# Patient Record
Sex: Female | Born: 1995
Health system: Southern US, Community
[De-identification: ages and names within clinical notes are randomized; demographics above are authoritative.]

## PROBLEM LIST (undated history)

## (undated) DIAGNOSIS — I2699 Other pulmonary embolism without acute cor pulmonale: Secondary | ICD-10-CM

## (undated) DIAGNOSIS — I82409 Acute embolism and thrombosis of unspecified deep veins of unspecified lower extremity: Secondary | ICD-10-CM

---
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
## 2016-05-22 IMAGING — CT CT ABD-PELV W/ CM
1 of 2 series · 15 of 32 positions shown, 19 images · IV contrast (OMNIPAQUE)
Comparison: 10/26/2015 and prior ultrasounds.

CLINICAL DATA: 19-year-old female with fever of unknown origin.
Currently on antibiotics for pyelonephritis. Recent spontaneous
abortion on 10/23/2015.

EXAM:
CT ABDOMEN AND PELVIS WITH CONTRAST
TECHNIQUE: Multidetector CT imaging of the abdomen and pelvis was performed
using the standard protocol following bolus administration of
intravenous contrast.
CONTRAST:  100mL OMNIPAQUE IOHEXOL 300 MG/ML  SOLN

[Series 2: routine abdomen/pelvis with · axial · 0.88mm/px · z∈[+625,+1080]mm · 15 of 99 slices shown, 19 images]
[im 4/99  soft-tissue]
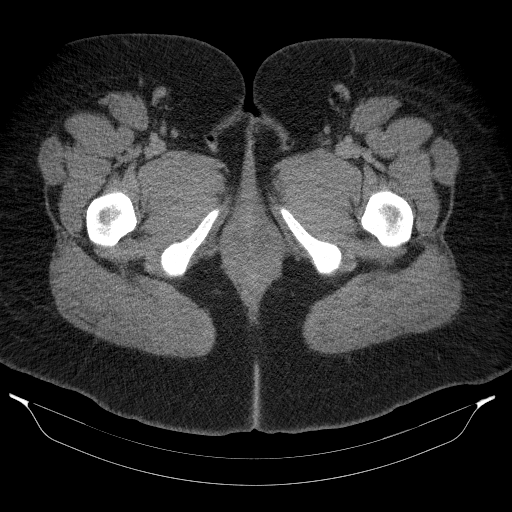
[im 4/99  bone]
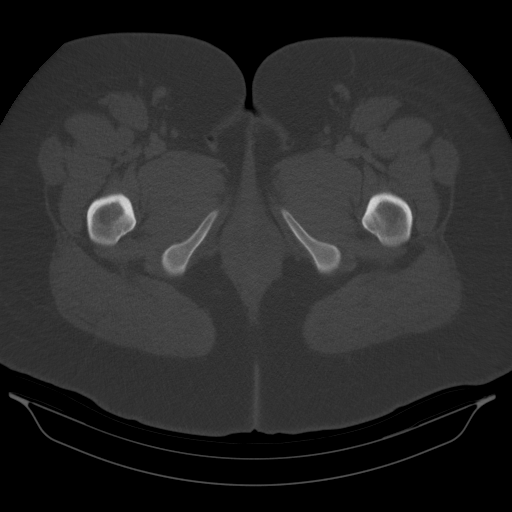
[im 12/99  soft-tissue]
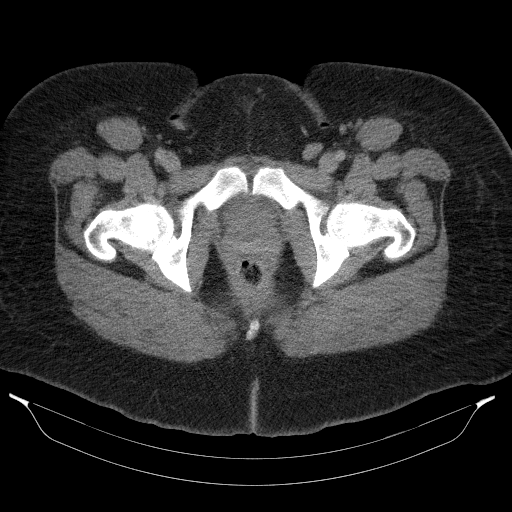
[im 20/99  soft-tissue]
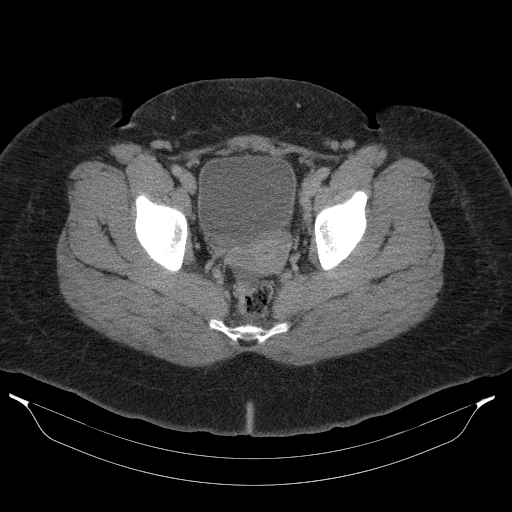
[im 28/99  soft-tissue]
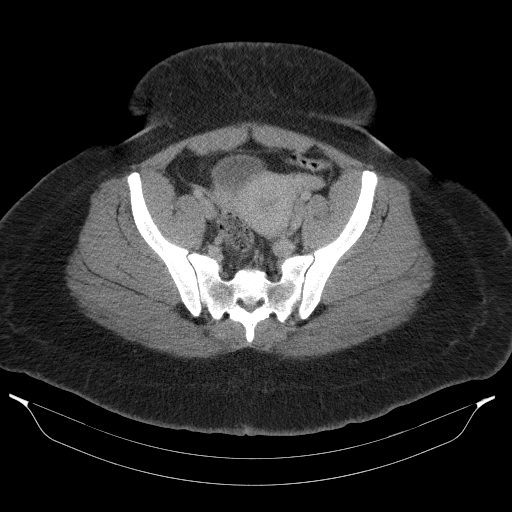
[im 36/99  soft-tissue]
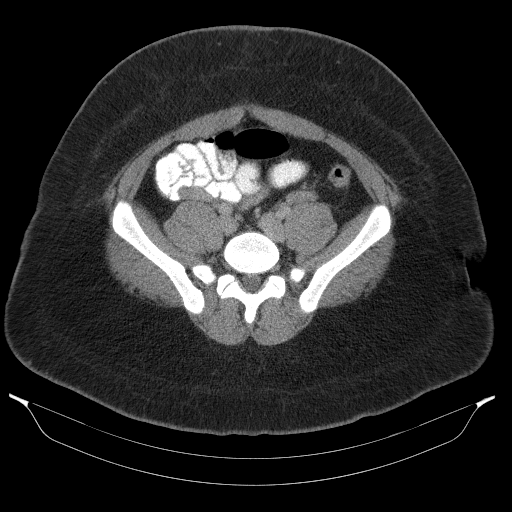
[im 44/99  soft-tissue]
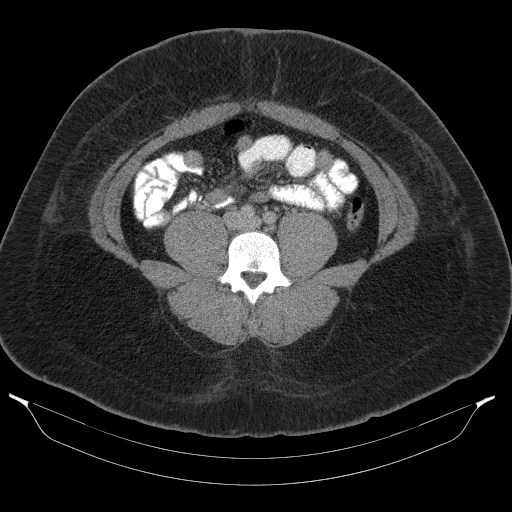
[im 51/99  soft-tissue]
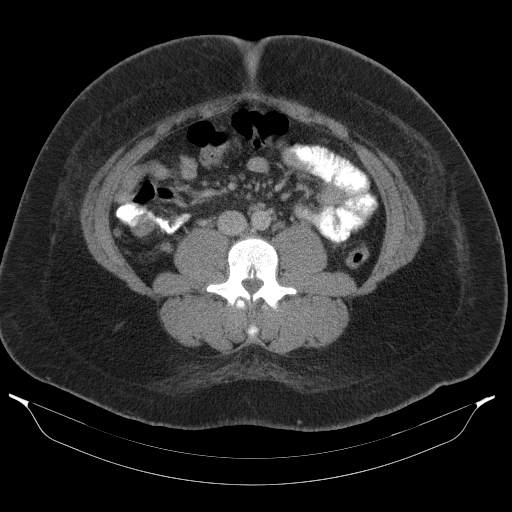
[im 55/99  soft-tissue]
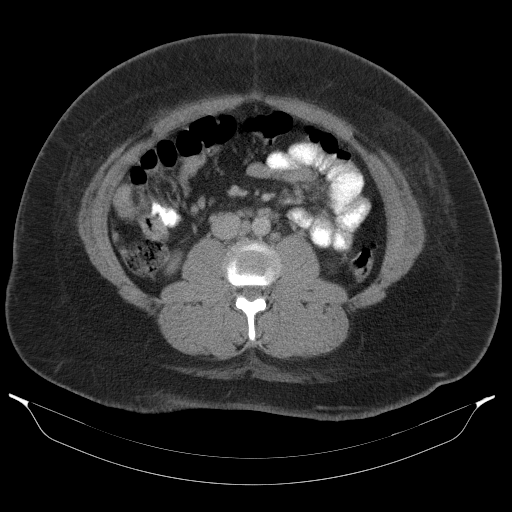
[im 63/99  soft-tissue]
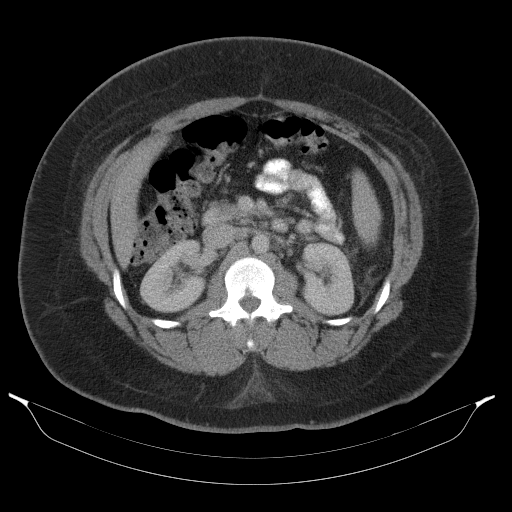
[im 63/99  bone]
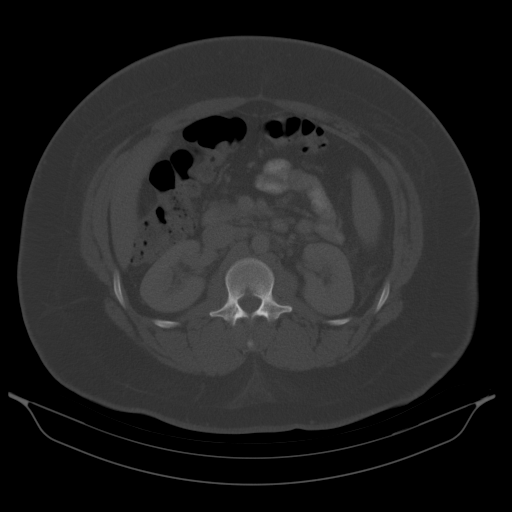
[im 71/99  soft-tissue]
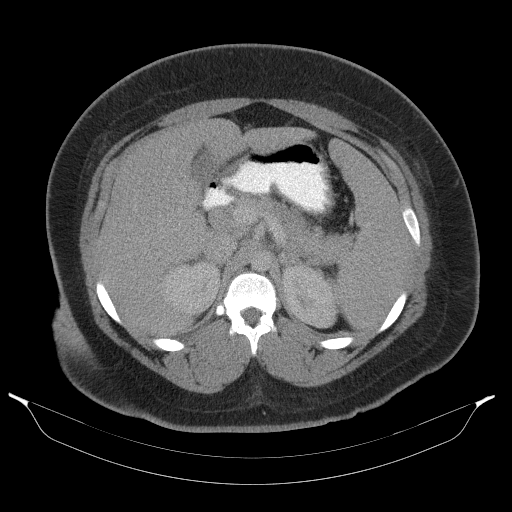
[im 79/99  soft-tissue]
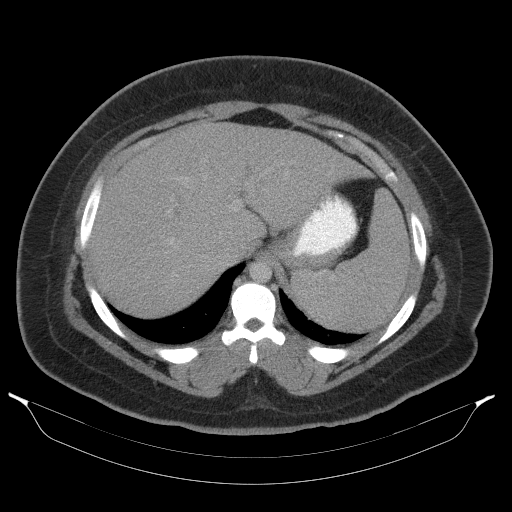
[im 83/99  lung]
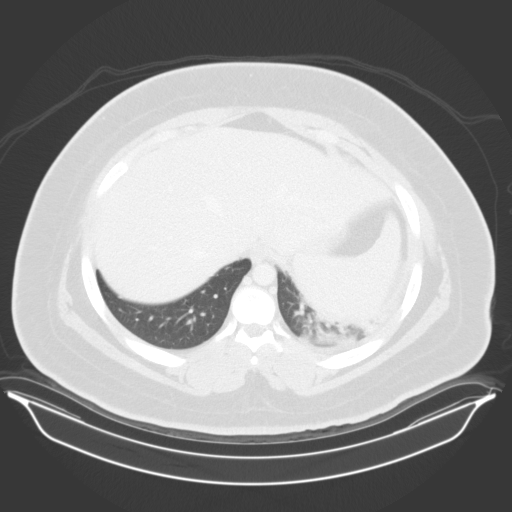
[im 87/99  soft-tissue]
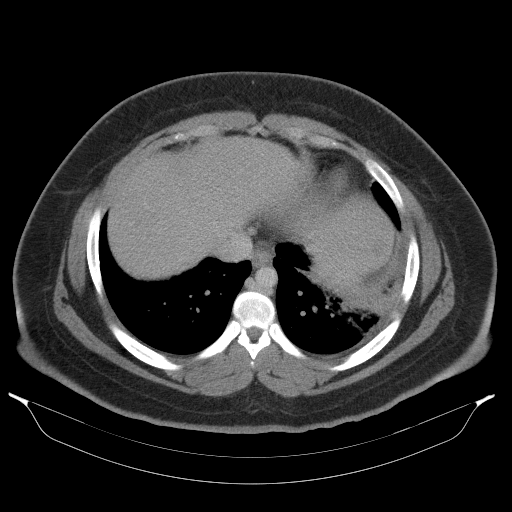
[im 87/99  lung]
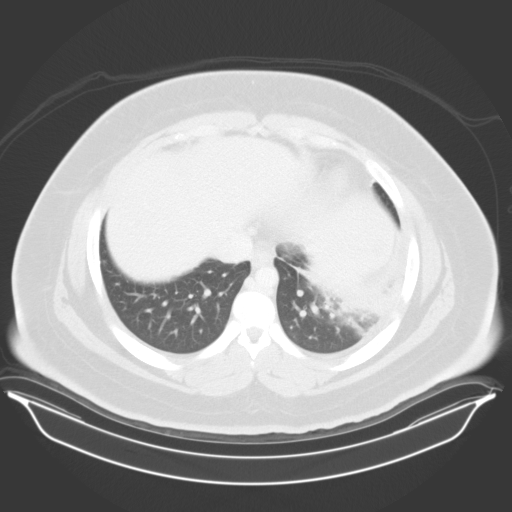
[im 91/99  lung]
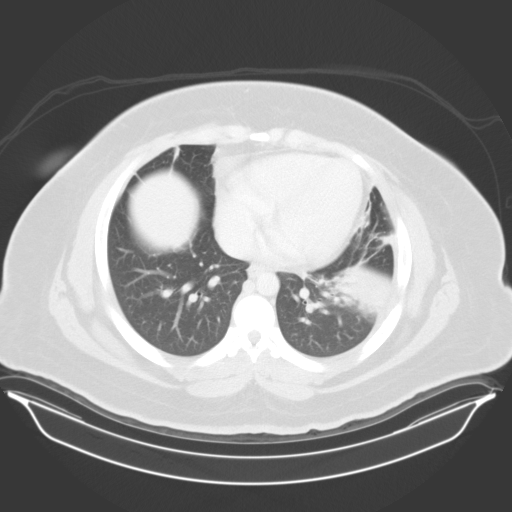
[im 95/99  soft-tissue]
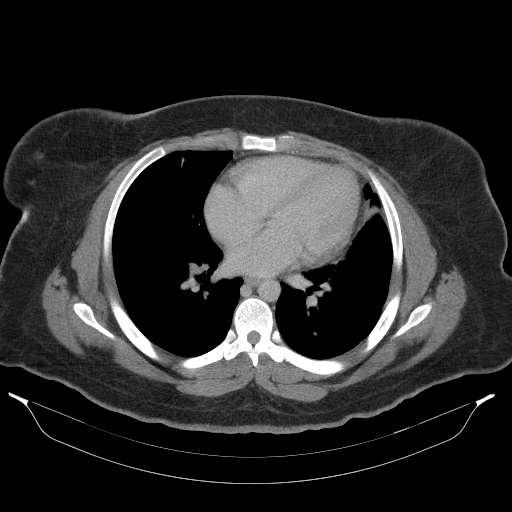
[im 95/99  lung]
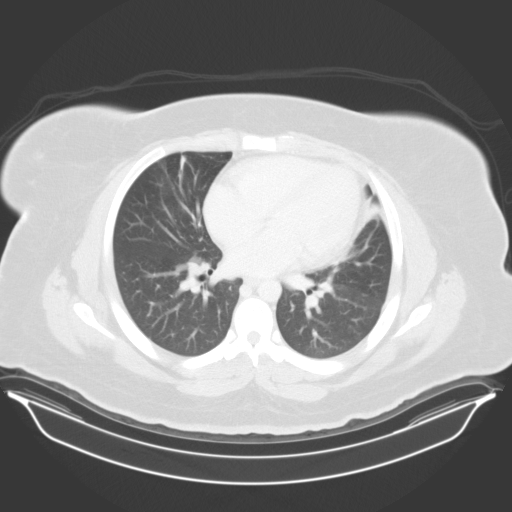

[15 of 32 positions shown; findings below may reference images not displayed]

FINDINGS: Lower chest: Moderate left lower lobe consolidation/possible
atelectasis noted and suspicious for pneumonia.

Hepatobiliary: The liver and gallbladder are unremarkable. There is
no evidence of biliary dilatation.

Pancreas: Unremarkable

Spleen: Unremarkable

Adrenals/Urinary Tract: The kidneys, adrenal glands and bladder are
unremarkable.

Stomach/Bowel: There is no evidence of bowel obstruction or focal
bowel wall thickening. The appendix is normal.

Vascular/Lymphatic: Shotty abdominal and retroperitoneal lymph nodes
are noted without enlarged lymph nodes identified. There is no
evidence of abdominal aortic aneurysm.

Reproductive: The uterus and adnexal regions are within normal
limits.

Other: No free fluid, abscess or pneumoperitoneum.

Musculoskeletal: No acute or suspicious abnormalities.
IMPRESSION: Left lower lobe consolidation/ possible atelectasis - suspicious for
pneumonia

No evidence of acute abnormality within the abdomen or pelvis.

## 2016-10-07 ENCOUNTER — Emergency Department (HOSPITAL_COMMUNITY): Payer: BLUE CROSS/BLUE SHIELD

## 2016-10-07 ENCOUNTER — Encounter (HOSPITAL_COMMUNITY): Payer: Self-pay | Admitting: Emergency Medicine

## 2016-10-07 ENCOUNTER — Emergency Department (HOSPITAL_BASED_OUTPATIENT_CLINIC_OR_DEPARTMENT_OTHER)
Admit: 2016-10-07 | Discharge: 2016-10-07 | Disposition: A | Discharge status: Home / Self Care | Payer: BLUE CROSS/BLUE SHIELD | Attending: Emergency Medicine | Admitting: Emergency Medicine

## 2016-10-07 ENCOUNTER — Observation Stay (HOSPITAL_COMMUNITY)
Admission: EM | Admit: 2016-10-07 | Discharge: 2016-10-08 | Disposition: A | Discharge status: Home / Self Care | Payer: BLUE CROSS/BLUE SHIELD | Attending: Internal Medicine | Admitting: Internal Medicine

## 2016-10-07 DIAGNOSIS — I82409 Acute embolism and thrombosis of unspecified deep veins of unspecified lower extremity: Secondary | ICD-10-CM

## 2016-10-07 DIAGNOSIS — Z6841 Body Mass Index (BMI) 40.0 and over, adult: Secondary | ICD-10-CM

## 2016-10-07 DIAGNOSIS — E871 Hypo-osmolality and hyponatremia: Secondary | ICD-10-CM | POA: Diagnosis not present

## 2016-10-07 DIAGNOSIS — B178 Other specified acute viral hepatitis: Secondary | ICD-10-CM | POA: Diagnosis present

## 2016-10-07 DIAGNOSIS — K759 Inflammatory liver disease, unspecified: Secondary | ICD-10-CM | POA: Insufficient documentation

## 2016-10-07 DIAGNOSIS — Z68.41 Body mass index (BMI) pediatric, greater than or equal to 95th percentile for age: Secondary | ICD-10-CM | POA: Insufficient documentation

## 2016-10-07 DIAGNOSIS — D72829 Elevated white blood cell count, unspecified: Secondary | ICD-10-CM | POA: Diagnosis not present

## 2016-10-07 DIAGNOSIS — M79661 Pain in right lower leg: Secondary | ICD-10-CM | POA: Diagnosis present

## 2016-10-07 DIAGNOSIS — I82441 Acute embolism and thrombosis of right tibial vein: Secondary | ICD-10-CM | POA: Diagnosis not present

## 2016-10-07 DIAGNOSIS — J9601 Acute respiratory failure with hypoxia: Secondary | ICD-10-CM | POA: Diagnosis not present

## 2016-10-07 DIAGNOSIS — B2709 Gammaherpesviral mononucleosis with other complications: Secondary | ICD-10-CM

## 2016-10-07 DIAGNOSIS — I272 Pulmonary hypertension, unspecified: Secondary | ICD-10-CM | POA: Diagnosis not present

## 2016-10-07 DIAGNOSIS — I82401 Acute embolism and thrombosis of unspecified deep veins of right lower extremity: Secondary | ICD-10-CM | POA: Diagnosis present

## 2016-10-07 DIAGNOSIS — M79609 Pain in unspecified limb: Secondary | ICD-10-CM | POA: Diagnosis not present

## 2016-10-07 DIAGNOSIS — I2699 Other pulmonary embolism without acute cor pulmonale: Secondary | ICD-10-CM | POA: Diagnosis not present

## 2016-10-07 HISTORY — DX: Other pulmonary embolism without acute cor pulmonale: I26.99

## 2016-10-07 HISTORY — DX: Acute embolism and thrombosis of unspecified deep veins of unspecified lower extremity: I82.409

## 2016-10-07 LAB — BASIC METABOLIC PANEL
Anion gap: 9 (ref 5–15)
BUN: 6 mg/dL (ref 6–20)
CALCIUM: 8.9 mg/dL (ref 8.9–10.3)
CO2: 21 mmol/L — AB (ref 22–32)
CREATININE: 1.27 mg/dL — AB (ref 0.44–1.00)
Chloride: 103 mmol/L (ref 101–111)
GFR calc Af Amer: >60 mL/min (ref >60)
GFR calc non Af Amer: >60 mL/min (ref >60)
GLUCOSE: 94 mg/dL (ref 65–99)
Potassium: 3.9 mmol/L (ref 3.5–5.1)
Sodium: 133 mmol/L — ABNORMAL LOW (ref 135–145)

## 2016-10-07 LAB — CBC WITH DIFFERENTIAL/PLATELET
BASOS PCT: 0 %
Basophils Absolute: 0 10*3/uL (ref 0.0–0.1)
EOS ABS: 0.5 10*3/uL (ref 0.0–0.7)
Eosinophils Relative: 4 %
HCT: 39.5 % (ref 36.0–46.0)
Hemoglobin: 13.7 g/dL (ref 12.0–15.0)
LYMPHS ABS: 2.6 10*3/uL (ref 0.7–4.0)
Lymphocytes Relative: 23 %
MCH: 28.2 pg (ref 26.0–34.0)
MCHC: 34.7 g/dL (ref 30.0–36.0)
MCV: 81.4 fL (ref 78.0–100.0)
MONO ABS: 0.5 10*3/uL (ref 0.1–1.0)
MONOS PCT: 4 %
Neutro Abs: 7.9 10*3/uL — ABNORMAL HIGH (ref 1.7–7.7)
Neutrophils Relative %: 69 %
Platelets: 225 10*3/uL (ref 150–400)
RBC: 4.85 MIL/uL (ref 3.87–5.11)
RDW: 16.3 % — AB (ref 11.5–15.5)
WBC: 11.5 10*3/uL — ABNORMAL HIGH (ref 4.0–10.5)

## 2016-10-07 LAB — I-STAT TROPONIN, ED: TROPONIN I, POC: 0 ng/mL (ref 0.00–0.08)

## 2016-10-07 LAB — HEPATIC FUNCTION PANEL
ALT: 23 U/L (ref 14–54)
AST: 27 U/L (ref 15–41)
Albumin: 3.7 g/dL (ref 3.5–5.0)
Alkaline Phosphatase: 59 U/L (ref 38–126)
BILIRUBIN DIRECT: 0.2 mg/dL (ref 0.1–0.5)
BILIRUBIN INDIRECT: 0.6 mg/dL (ref 0.3–0.9)
BILIRUBIN TOTAL: 0.8 mg/dL (ref 0.3–1.2)
Total Protein: 6.8 g/dL (ref 6.5–8.1)

## 2016-10-07 LAB — I-STAT BETA HCG BLOOD, ED (MC, WL, AP ONLY)

## 2016-10-07 LAB — I-STAT CREATININE, ED: Creatinine, Ser: 1.2 mg/dL — ABNORMAL HIGH (ref 0.44–1.00)

## 2016-10-07 LAB — TROPONIN I: Troponin I: <0.03 ng/mL (ref <0.03)

## 2016-10-07 MED ORDER — ACETAMINOPHEN 650 MG RE SUPP: 650.0000 mg | Freq: Four times a day (QID) | RECTAL | Status: DC | PRN

## 2016-10-07 MED ORDER — RIVAROXABAN 15 MG PO TABS
15.0000 mg | ORAL_TABLET | Freq: Once | ORAL | Status: AC
  Administered 2016-10-07: 15 mg via ORAL
  Filled 2016-10-07: qty 1

## 2016-10-07 MED ORDER — ACETAMINOPHEN 325 MG PO TABS: 650.0000 mg | ORAL_TABLET | Freq: Four times a day (QID) | ORAL | Status: DC | PRN

## 2016-10-07 MED ORDER — SODIUM CHLORIDE 0.9% FLUSH: 3.0000 mL | Freq: Two times a day (BID) | INTRAVENOUS | Status: DC

## 2016-10-07 MED ORDER — RIVAROXABAN 15 MG PO TABS
15.0000 mg | ORAL_TABLET | Freq: Two times a day (BID) | ORAL | Status: DC
  Filled 2016-10-07: qty 1

## 2016-10-07 MED ORDER — MELOXICAM 15 MG PO TABS
15.0000 mg | ORAL_TABLET | Freq: Once | ORAL | Status: AC
  Administered 2016-10-07: 15 mg via ORAL
  Filled 2016-10-07: qty 1

## 2016-10-07 MED ORDER — SODIUM CHLORIDE 0.9 % IV SOLN
INTRAVENOUS | Status: DC
  Administered 2016-10-07 – 2016-10-08 (×2): via INTRAVENOUS

## 2016-10-07 MED ORDER — RIVAROXABAN 20 MG PO TABS: 20.0000 mg | ORAL_TABLET | Freq: Every day | ORAL | Status: DC

## 2016-10-07 MED ORDER — IOPAMIDOL (ISOVUE-370) INJECTION 76%
INTRAVENOUS | Status: AC
  Administered 2016-10-07: 100 mL
  Filled 2016-10-07: qty 100

## 2016-10-07 MED ORDER — TRAMADOL HCL 50 MG PO TABS: 50.0000 mg | ORAL_TABLET | Freq: Four times a day (QID) | ORAL | Status: DC | PRN

## 2016-10-07 NOTE — ED Triage Notes (Signed)
States developed right calf pain radiating to right foot. Pedal pulse +2 full sensation. Pain currently 7/10 achy sore.

## 2016-10-07 NOTE — Progress Notes (Signed)
Patient arrived to unit, denies pain, CCMD notified, call bell within reach, and patient oriented to unit.  Karen HarrisonSamantha K Smrithi Pigford, RN

## 2016-10-07 NOTE — ED Notes (Signed)
EDPA requested we hold Xarelto until PE study has been performed and results are available.

## 2016-10-07 NOTE — Progress Notes (Signed)
VASCULAR LAB PRELIMINARY  PRELIMINARY  PRELIMINARY  PRELIMINARY  Right lower extremity venous duplex completed.    Preliminary report:  There is acute DVT in the right posterior tibial vein.  All other veins appear thrombus free.   Called report to Terance HartKelly Gekas, PA-C  Rainn Zupko, RVT 10/07/2016, 11:01 AM

## 2016-10-07 NOTE — ED Provider Notes (Signed)
[facility]-EMERGENCY DEPT Provider Note   CSN: [id number] Arrival date & time: 10/07/16  4098   By signing my name below, I, Teofilo Pod, attest that this documentation has been prepared under the direction and in the presence of Terance Hart, PA-C. Electronically Signed: Teofilo Pod, ED Scribe. 10/07/2016. 10:10 AM.   History   Chief Complaint Chief Complaint  Patient presents with  . Leg Pain   The history is provided by the patient. No language interpreter was used.   HPI Comments:  Karen Pugh is a 20 y.o. female who presents to the Emergency Department complaining of constant lower right leg pain x 3 days. Pt states that the pain is located primarily on the back of her right calf with some associated swelling, and with pain radiating to her toes. Pt rates the pain at 7/10. Pt is ambulatory. Pt states that she [facility] and had blood drawn to test for DVT, no results as of now. Pt currently takes birth control pill. Pt has taken ibuprofen for pain with no relief. Pt denies chest pain, SOB. Pt also denies injury/trauma, pain elsewhere.   Past Medical History:  Diagnosis Date  . Medical history non-contributory     Patient Active Problem List   Diagnosis Date Noted  . EBV hepatitis   . Acute pyelonephritis   . Elevated liver enzymes   . Fever 10/23/2015  . Pyelonephritis affecting pregnancy in first trimester 10/23/2015  . Acute renal insufficiency 10/23/2015  . SAB (spontaneous abortion) 10/23/2015    Past Surgical History:  Procedure Laterality Date  . DILATION AND CURETTAGE OF UTERUS      OB History    Gravida Para Term Preterm AB Living   2       1     SAB TAB Ectopic Multiple Live Births   1               Home Medications    Prior to Admission medications   Not on File    Family History No family history on file.  Social History Social History  Substance Use Topics  . Smoking status: Never Smoker  .  Smokeless tobacco: Never Used  . Alcohol use No     Allergies   Review of patient's allergies indicates no known allergies.   Review of Systems Review of Systems  Respiratory: Negative for shortness of breath.   Cardiovascular: Negative for chest pain.  Musculoskeletal: Positive for arthralgias, joint swelling and myalgias.  All other systems reviewed and are negative.    Physical Exam Updated Vital Signs BP 110/83 (BP Location: Right Arm)   Pulse 109   Temp 98.2 F (36.8 C) (Oral)   Resp 20   Ht 5\' 5"  (1.651 m)   Wt (!) 305 lb (138.3 kg)   SpO2 100%   BMI 50.75 kg/m   Physical Exam  Constitutional: She appears well-developed and well-nourished. No distress.  HENT:  Head: Normocephalic and atraumatic.  Eyes: Conjunctivae are normal.  Cardiovascular: Regular rhythm.  Tachycardia present.  Exam reveals no gallop and no friction rub.   No murmur heard. Pulmonary/Chest: Effort normal and breath sounds normal. No respiratory distress. She has no wheezes. She has no rales. She exhibits no tenderness.  Abdominal: She exhibits no distension.  Musculoskeletal:  R calf tenderness posteriorly. No edema or erythema  Neurological: She is alert.  Skin: Skin is warm and dry.  Psychiatric: She has a normal mood and affect.  Nursing note and vitals reviewed.    ED Treatments / Results  DIAGNOSTIC STUDIES:  Oxygen Saturation is 100% on RA, normal by my interpretation.    COORDINATION OF CARE:  10:10 AM Will order ultrasound. Discussed treatment plan with pt at bedside and pt agreed to plan.   Labs (all labs ordered are listed, but only abnormal results are displayed) Labs Reviewed  BASIC METABOLIC PANEL - Abnormal; Notable for the following:       Result Value   Sodium 133 (*)    CO2 21 (*)    Creatinine, Ser 1.27 (*)    All other components within normal limits  CBC WITH DIFFERENTIAL/PLATELET - Abnormal; Notable for the following:    WBC 11.5 (*)    RDW 16.3 (*)      Neutro Abs 7.9 (*)    All other components within normal limits  I-STAT CREATININE, ED - Abnormal; Notable for the following:    Creatinine, Ser 1.20 (*)    All other components within normal limits  HEPATIC FUNCTION PANEL  TROPONIN I  TROPONIN I  TROPONIN I  I-STAT BETA HCG BLOOD, ED ([facility], [facility], [facility] ONLY)  I-STAT TROPOININ, ED    EKG  EKG Interpretation None       Radiology Ct Angio Chest Pe W/cm &/or Wo Cm  Result Date: 10/07/2016 CLINICAL DATA:  Tachycardia and decreased oxygen saturation. Shortness of breath EXAM: CT ANGIOGRAPHY CHEST WITH CONTRAST TECHNIQUE: Multidetector CT imaging of the chest was performed using the standard protocol during bolus administration of intravenous contrast. Multiplanar CT image reconstructions and MIPs were obtained to evaluate the vascular anatomy. CONTRAST:  100 mL Isovue 370 nonionic COMPARISON:  None. FINDINGS: Cardiovascular: There are multiple pulmonary emboli noted throughout multiple lower lobe branches bilaterally. There is also pulmonary embolus in branches of the right upper lobe. Pulmonary embolus arises at the origin of the right upper lobe pulmonary artery. The right ventricle to left ventricle diameter ratio is less than 0.9 and does not meet criteria for right heart strain. There is no appreciable thoracic aortic aneurysm or dissection. The visualized great vessels appear unremarkable. There is no appreciable pericardial thickening. The diameter of the main pulmonary outflow tract is 4.0 cm suggesting a degree of underlying pulmonary arterial hypertension. Mediastinum/Nodes: Visualized thyroid is normal. There is no evident thoracic adenopathy. Lungs/Pleura: There is no edema or consolidation. No pleural effusions are evident. There is no evidence suggesting pulmonary infarct. There is slight atelectasis in the anterior left base. Upper Abdomen: Visualized upper abdominal structures appear unremarkable. Musculoskeletal: There are no blastic  or lytic bone lesions. Review of the MIP images confirms the above findings. IMPRESSION: Multiple pulmonary emboli without right heart strain. Prominence of the main pulmonary artery suggesting underlying pulmonary arterial hypertension. Lungs clear except for slight anterior left base atelectasis. No evidence of adenopathy. Critical Value/emergent results were called by telephone at the time of interpretation on 10/07/2016 at 12:28 pm to Dr. Terance HartKELLY Holden Maniscalco , who verbally acknowledged these results. Electronically Signed   By: Bretta BangWilliam  Woodruff III M.D.   On: 10/07/2016 12:29    Procedures Procedures (including critical care time)  Medications Ordered in ED Medications  meloxicam (MOBIC) tablet 15 mg (15 mg Oral Given 10/07/16 1113)  iopamidol (ISOVUE-370) 76 % injection (100 mLs  Contrast Given 10/07/16 1204)     Initial Impression / Assessment and Plan / ED Course  I have reviewed the triage vital signs and the nursing notes.  Pertinent labs & imaging  results that were available during my care of the patient were reviewed by me and considered in my medical decision making (see chart for details).  Clinical Course   20 year old female presents with DVT and multiple PEs most likely due to her OCP use. She has resting tachycardia and ambulatory sats have dropped from 100 to 90%. LE doppler confirms DVT and CTA shows multiple PEs without evidence of right heart strain. Xarelto given. Of note, CT remarks on prominence of pulmonary artery suggestive of pulmonary HTN. CBC remarkable for mild leukocytosis of 11.5. BMP remarkable for hyponatremia (133), CO2 of 21, SCr of 1.27. Initial trop is 0. Spoke with Junious Silk who will admit patient to tele obs under Dr. Laurena Spies. Appreciate assistance.   Final Clinical Impressions(s) / ED Diagnoses   Final diagnoses:  Other acute pulmonary embolism without acute cor pulmonale (HCC)  Acute deep vein thrombosis (DVT) of tibial vein of right lower extremity  (HCC)    New Prescriptions New Prescriptions   No medications on file   I personally performed the services described in this documentation, which was scribed in my presence. The recorded information has been reviewed and is accurate.     Bethel Born, PA-C 10/08/16 1610    Rolland Porter, MD 10/21/16 [phone]

## 2016-10-07 NOTE — ED Notes (Signed)
SpO2 dropped to 90% with ambulation

## 2016-10-07 NOTE — H&P (Signed)
History and Physical    Karen ScalesLauren C Pugh ZOX:[id number] DOB: 1996/03/21 DOA: 10/07/2016   PCP: No primary care provider on file. / UNASSIGNED  Patient coming from/Resides with: Private residence/lives with roommates  Admission status: Observation/telemetry -but need to reevaluate in 24 hours in the event it is medically necessary to stay a minimum 2 midnights to rule out impending and/or unexpected changes in physiologic status that may differ from initial evaluation performed in the ER and/or at time of admission. Patient presents with acute DVT with mild hypoxemia with exertion. No RV strain seen on CT. Troponin pending at time of admission. Plans are to initiate NOAC and monitor closely. Patient was asymptomatic (other than mild hypoxemia-was not short of breath) regarding pulmonary emboli at the time of presentation.  Chief Complaint: Right calf pain  HPI: Karen ScalesLauren C Pugh is a 20 y.o. female with medical history significant for Epstein-Barr virus related hepatitis, morbid obesity with BMI greater than 50 and prior spontaneous abortion in setting of pyelonephritis in the first trimester. Patient reports that this past Monday on 10/9 she developed significant right calf pain unresponsive to over-the-counter analgesics. She denies taking any recent prolonged trips. Does not feel she is dehydrated. No new medications. Denied shortness of breath or chest pain. Patient does take birth control pills. [facility] but because of increasing pain and mild swelling she presented to the ER for further evaluation. Initial plan in the ER was to pursue outpatient therapy and initiate Xarelto and discharge patient from the ER. She was found to have resting tachycardia and when ambulated O2 saturation decreased to 90% therefore raising suspicion for pulmonary emboli. CT of the chest confirmed bilateral PE without RV strain.  ED Course:  Vital Signs:  BP 108/62   Pulse 88   Temp 98.2 F (36.8 C) (Oral)   Resp 18   Ht 5\' 5"  (1.651 m)   Wt (!) 138.3 kg (305 lb)   SpO2 98%   BMI 50.75 kg/m  Lower extremity duplex: Preliminary report of acute DVT in the right posterior tibial vein -other veins appear to be thrombus free CT angiography of the chest with and without contrast: multiple bilateral PE without right heart strain; incidental finding of prominence of main pulmonary artery suggesting underlying pulmonary hypertension Lab data: I-STAT creatinine 1.2- BMET pending at time of admission, WBC 11,500 with neutrophils 69% and absolute neutrophils 7.9%, hemoglobin 13.7, platelets 225,000, i-STAT HCg quantitative less than 5; troponin pending Medications and treatments: Mobic 15 mg by mouth 1, Xarelto15 mg by mouth 1  Review of Systems:  In addition to the HPI above,  No Fever-chills, myalgias or other constitutional symptoms No Headache, changes with Vision or hearing, new weakness, tingling, numbness in any extremity, dizziness, dysarthria or word finding difficulty, gait disturbance or imbalance, tremors or seizure activity No problems swallowing food or Liquids, indigestion/reflux, choking or coughing while eating, abdominal pain with or after eating No Chest pain, Cough or Shortness of Breath, palpitations, orthopnea or DOE No Abdominal pain, N/V, melena,hematochezia, dark tarry stools, constipation No dysuria, malodorous urine, hematuria or flank pain No new skin rashes, lesions, masses or bruises, No new joint pains, swelling or redness No recent unintentional weight gain or loss No polyuria, polydypsia or polyphagia   Past Medical History:  Diagnosis Date  . Medical history non-contributory     Past Surgical History:  Procedure Laterality Date  . DILATION AND CURETTAGE OF UTERUS  Social History   Social History  . Marital status: Single    Spouse name: N/A  . Number of children: N/A  . Years of education: N/A     Occupational History  . Not on file.   Social History Main Topics  . Smoking status: Never Smoker  . Smokeless tobacco: Never Used  . Alcohol use No  . Drug use: No  . Sexual activity: Yes    Birth control/ protection: None   Other Topics Concern  . Not on file   Social History Narrative  . 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 Posterior pharynx clear of any exudate or lesions.Normal dentition.  Neck: normal, supple, no masses, no thyromegaly Respiratory: clear to auscultation bilaterally, no wheezing, no crackles. Normal respiratory effort. No accessory muscle use.  Cardiovascular: Regular rate and rhythm, no murmurs / rubs / gallops.Subtle right calf edema. 2+ pedal pulses. No carotid bruits.  Abdomen: no tenderness, no masses palpated. No hepatosplenomegaly. Bowel sounds positive.  Musculoskeletal: no clubbing / cyanosis. No joint deformity upper and lower extremities. Good ROM, no contractures. Normal muscle tone.  Skin: no rashes, lesions, ulcers. No induration Neurologic: CN 2-12 grossly intact. Sensation intact, DTR normal. Strength 5/5 x all 4 extremities.  Psychiatric: Normal judgment and insight. Alert and oriented x 3. Normal mood.    Labs on Admission: I have personally reviewed following labs and imaging  studies  CBC:  Recent Labs Lab 10/07/16 1230  WBC 11.5*  NEUTROABS 7.9*  HGB 13.7  HCT 39.5  MCV 81.4  PLT 225   Basic Metabolic Panel:  Recent Labs Lab 10/07/16 1200  CREATININE 1.20*   GFR: Estimated Creatinine Clearance: 106.5 mL/min (by C-G formula based on SCr of 1.2 mg/dL (H)). Liver Function Tests: No results for input(s): AST, ALT, ALKPHOS, BILITOT, PROT, ALBUMIN in the last 168 hours. No results for input(s): LIPASE, AMYLASE in the last 168 hours. No results for input(s): AMMONIA in the last 168 hours. Coagulation Profile: No results for input(s): INR, PROTIME in the last 168 hours. Cardiac Enzymes: No results for input(s): CKTOTAL, CKMB, CKMBINDEX, TROPONINI in the last 168 hours. BNP (last 3 results) No results for input(s): PROBNP in the last 8760 hours. HbA1C: No results for input(s): HGBA1C in the last 72 hours. CBG: No results for input(s): GLUCAP in the last 168 hours. Lipid Profile: No results for input(s): CHOL, HDL, LDLCALC, TRIG, CHOLHDL, LDLDIRECT in the last 72 hours. Thyroid Function Tests: No results for input(s): TSH, T4TOTAL, FREET4, T3FREE, THYROIDAB in the last 72 hours. Anemia Panel: No results for input(s): VITAMINB12, FOLATE, FERRITIN, TIBC, IRON, RETICCTPCT in the last 72 hours. Urine analysis:    Component Value Date/Time   COLORURINE BROWN (A) 10/23/2015 1300   APPEARANCEUR  CLOUDY (A) 10/23/2015 1300   LABSPEC 1.020 10/23/2015 1300   PHURINE 5.5 10/23/2015 1300   GLUCOSEU NEGATIVE 10/23/2015 1300   HGBUR LARGE (A) 10/23/2015 1300   BILIRUBINUR MODERATE (A) 10/23/2015 1300   KETONESUR 15 (A) 10/23/2015 1300   PROTEINUR 100 (A) 10/23/2015 1300   UROBILINOGEN 4.0 (H) 10/23/2015 1300   NITRITE POSITIVE (A) 10/23/2015 1300   LEUKOCYTESUR MODERATE (A) 10/23/2015 1300   Sepsis Labs: @LABRCNTIP (procalcitonin:4,lacticidven:4) )No results found for this or any previous visit (from the past 240 hour(s)).   Radiological Exams on  Admission: Ct Angio Chest Pe W/cm &/or Wo Cm  Result Date: 10/07/2016 CLINICAL DATA:  Tachycardia and decreased oxygen saturation. Shortness of breath EXAM: CT ANGIOGRAPHY CHEST WITH CONTRAST TECHNIQUE: Multidetector CT imaging of the chest was performed using the standard protocol during bolus administration of intravenous contrast. Multiplanar CT image reconstructions and MIPs were obtained to evaluate the vascular anatomy. CONTRAST:  100 mL Isovue 370 nonionic COMPARISON:  None. FINDINGS: Cardiovascular: There are multiple pulmonary emboli noted throughout multiple lower lobe branches bilaterally. There is also pulmonary embolus in branches of the right upper lobe. Pulmonary embolus arises at the origin of the right upper lobe pulmonary artery. The right ventricle to left ventricle diameter ratio is less than 0.9 and does not meet criteria for right heart strain. There is no appreciable thoracic aortic aneurysm or dissection. The visualized great vessels appear unremarkable. There is no appreciable pericardial thickening. The diameter of the main pulmonary outflow tract is 4.0 cm suggesting a degree of underlying pulmonary arterial hypertension. Mediastinum/Nodes: Visualized thyroid is normal. There is no evident thoracic adenopathy. Lungs/Pleura: There is no edema or consolidation. No pleural effusions are evident. There is no evidence suggesting pulmonary infarct. There is slight atelectasis in the anterior left base. Upper Abdomen: Visualized upper abdominal structures appear unremarkable. Musculoskeletal: There are no blastic or lytic bone lesions. Review of the MIP images confirms the above findings. IMPRESSION: Multiple pulmonary emboli without right heart strain. Prominence of the main pulmonary artery suggesting underlying pulmonary arterial hypertension. Lungs clear except for slight anterior left base atelectasis. No evidence of adenopathy. Critical Value/emergent results were called by telephone at  the time of interpretation on 10/07/2016 at 12:28 pm to Dr. Terance Hart , who verbally acknowledged these results. Electronically Signed   By: Bretta Bang III M.D.   On: 10/07/2016 12:29    EKG: (Independently reviewed) pending  Assessment/Plan Principal Problem:   Bilateral pulmonary embolism  -Patient presented with right calf pain and was found to have DVT; later clinical data suggestive of possible PE observed by ED staff so CT chest obtained that revealed bilateral PE without evidence of R heart strain but there was evidence of pulmonary hypertension -Begin anticoagulation with NOAC; risks and benefits discussed with patient per my attending physician -Continue other supportive care (prn O2,etc) -Follow-up on troponin and cycle -Echocardiogram -Currently normotensive with systolic readings in the 100s but will begin IV fluid at 75 mL per hour -Anticipate will need a minimum of 6 months anticoagulation-this is first episode  Active Problems:   Right leg DVT  -Initial presenting symptom -Anticoagulation as above -Stop birth control pills; discussed with patient need to follow-up with gynecologist to decide upon most appropriate birth control method -Discussed with patient to anticipate heavier than usual periods while on anticoagulation -Treat pain with Tylenol and Ultram avoiding NSAIDs in setting of initiation of anticoagulation    Acute respiratory failure with hypoxia  -Patient without complaints of shortness  of breath at presentation but noted to have O2 saturations down to 90% with ambulation which does not meet criteria for home health oxygen -prn O2 has been ordered based on pulse oximetry less than or equal to 92%    Pulmonary hypertension -Uncertain if related to acute PEs or secondary to other underlying health concerns noting patient is morbidly obese and may have undiagnosed sleep apnea/obesity hypoventilation syndrome -Follow up on echocardiogram    EBV  hepatitis -Has had significant transaminitis in the past -Last LFTs were checked in 2016 and revealed only mild elevation in liver functions -Check LFTs now-if higher than baseline from 2016 need to rethink utilization of Ultram for pain    Morbid obesity with BMI of 50.0-59.9, adult  -Discussed that obesity also contributed to formation of her DVT -Nutrition consultation for weight reduction strategies; patient is a Consulting civil engineer at a [employer] and if available may benefit from support groups and follow-up nutritional counseling and support after discharge      DVT prophylaxis: NOAC  Code Status: Full Family Communication: No family at bedside at time of evaluation; when discussed with patient she reports several providers have been on the phone with her mother explaining her condition  Disposition Plan: Anticipate discharge back to the [facility] setting in the next 24 hours Consults called: None    ELLIS,ALLISON L. ANP-BC [facility] Pager [phone]   If 7PM-7AM, please contact night-coverage [email or web address] Password [email or web address]  10/07/2016, 1:37 PM

## 2016-10-08 ENCOUNTER — Observation Stay (HOSPITAL_BASED_OUTPATIENT_CLINIC_OR_DEPARTMENT_OTHER): Payer: BLUE CROSS/BLUE SHIELD

## 2016-10-08 DIAGNOSIS — I2699 Other pulmonary embolism without acute cor pulmonale: Secondary | ICD-10-CM

## 2016-10-08 LAB — ECHOCARDIOGRAM COMPLETE
HEIGHTINCHES: 65 in
WEIGHTICAEL: 4920 [oz_av]

## 2016-10-08 LAB — TROPONIN I: Troponin I: <0.03 ng/mL (ref <0.03)

## 2016-10-08 MED ORDER — RIVAROXABAN 15 MG PO TABS
15.0000 mg | ORAL_TABLET | Freq: Two times a day (BID) | ORAL | Status: DC
  Administered 2016-10-08: 15 mg via ORAL
  Filled 2016-10-08: qty 1

## 2016-10-08 MED ORDER — RIVAROXABAN (XARELTO) VTE STARTER PACK (15 & 20 MG): ORAL_TABLET | ORAL | 0 refills | Status: AC

## 2016-10-08 MED FILL — XARELTO STARTER PACK: 15 & 20 | 30 days supply | Qty: 51 | Fill #0

## 2016-10-08 NOTE — Discharge Instructions (Signed)

## 2016-10-08 NOTE — Progress Notes (Signed)
Patient ambulated in hall with RN and tolerated well with no complaints of shortness of breath. Sp02 maintained 95%-99%. HR 105. MD paged to make aware

## 2016-10-08 NOTE — Progress Notes (Signed)
ANTICOAGULATION CONSULT NOTE - Initial Consult  Pharmacy Consult for Xarelto Indication: pulmonary embolus  No Known Allergies  Patient Measurements: Height: 5\' 5"  (165.1 cm) Weight: (!) 307 lb 8 oz (139.5 kg) IBW/kg (Calculated) : 57  Vital Signs: Temp: 97.8 F (36.6 C) (10/13 0541) Temp Source: Oral (10/13 0541) BP: 92/47 (10/13 0541) Pulse Rate: 89 (10/13 0541)  Labs:  Recent Labs  10/07/16 1200 10/07/16 1230 10/07/16 2001 10/08/16 0225  HGB  --  13.7  --   --   HCT  --  39.5  --   --   PLT  --  225  --   --   CREATININE 1.20* 1.27*  --   --   TROPONINI  --   --  <0.03 <0.03    Estimated Creatinine Clearance: 101.2 mL/min (by C-G formula based on SCr of 1.27 mg/dL (H)).   Medical History: Past Medical History:  Diagnosis Date  . Bilateral pulmonary embolism (HCC) 10/07/2016  . DVT (deep venous thrombosis) (HCC) 10/07/2016   RLE    Medications:  No prescriptions prior to admission.    Assessment: Karen ClementLauren C Autryis a 20 y.o.femalewith a Past Medical History of obesity and OCPwho presented with right calf pain and was found to have DVT. CTA in emergency room also showed bilateral PEs without right heart strain. Pharmacy consulted to start rivaroxaban treatment.  Goal of Therapy:  anticoagulation Monitor platelets by anticoagulation protocol: Yes   Plan:  Start Xarelto (rivaroxaban) 15 mg PO BID with meals x 21 days, followed by 20 mg daily Plan for at least 6 months anticoagulation for first episode Monitor CBC, renal function   Thank you for allowing us to participate in this patients care. Signe Coltonya C Asuncion Shibata, PharmD Pager: [phone] 10/08/2016,8:13 AM

## 2016-10-08 NOTE — Progress Notes (Signed)
Patient given discharge instructions, verbalized understanding, CCMD notified, IV removed, and prescription given to patient.  Karen HarrisonSamantha K Pugh Plunk, RN

## 2016-10-08 NOTE — Progress Notes (Signed)
Initial Nutrition Assessment  DOCUMENTATION CODES:   Morbid obesity  INTERVENTION:   - Encourage general, healthful diet and activity. - Will continue to monitor for nutrition-related needs.  NUTRITION DIAGNOSIS:   Food and nutrition related knowledge deficit related to limited prior education as evidenced by per patient/family report.  GOAL:   Patient will meet greater than or equal to 90% of their needs  MONITOR:   PO intake, Weight trends, Labs, I & O's  REASON FOR ASSESSMENT:   Consult  (Resources for support groups and nutrition follow-up at [employer])  ASSESSMENT:   20 y.o. female with medical history significant for Epstein-Barr virus related hepatitis, morbid obesity with BMI greater than 50 and prior spontaneous abortion in setting of pyelonephritis in the first trimester. Patient reports that this past Monday on 10/9 she developed significant right calf pain unresponsive to over-the-counter analgesics. She denies taking any recent prolonged trips. Does not feel she is dehydrated. No new medications. Denied shortness of breath or chest pain. Patient does take birth control pills. [facility] but because of increasing pain and mild swelling she presented to the ER for further evaluation. Initial plan in the ER was to pursue outpatient therapy and initiate Xarelto and discharge patient from the ER. She was found to have resting tachycardia and when ambulated O2 saturation decreased to 90% therefore raising suspicion for pulmonary emboli. CT of the chest confirmed bilateral PE without RV strain.  Spoke with pt at bedside who reports consuming 2-3 meals per day PTA; pt states she tries to eat breakfast but is unable to on some days due to early classes. Pt lives in a dorm on campus and is able to walk to classes and to the cafeteria for meals. Pt reports good appetite. Pt seems unwilling to provide further  information with these responses.  Pt states she spends most of her time in her dorm room when she is not in class. Suspect pt is minimally active outside of walking around campus.  Pt denies any nutrition-related concerns at this time. Discussed a generally, healthful diet with pt: fruits, vegetables, whole grains, and lean protein. Pt denies any additional questions.  Medications and labs reviewed.  NFPE: Limited exam completed. No fat depletion, no muscle depletion, and unable to assess edema.  Diet Order:  Diet regular Room service appropriate? Yes; Fluid consistency: Thin  Skin:  Reviewed, no issues  Last BM:  10/07/16  Height:   Ht Readings from Last 1 Encounters:  10/07/16 5\' 5"  (1.651 m) (61 %, Z= 0.28)*   * Growth percentiles are based on CDC 2-20 Years data.    Weight:   Wt Readings from Last 1 Encounters:  10/07/16 (!) 307 lb 8 oz (139.5 kg) (>99 %, Z > 2.33)*   * Growth percentiles are based on CDC 2-20 Years data.    Ideal Body Weight:  56.8 kg  BMI:  Body mass index is 51.17 kg/m.  Estimated Nutritional Needs:   Kcal:  2000-2200 (14-16 kcal/kg)  Protein:  68-80 grams (1.2-1.4 g/kg IBW)  Fluid:  2.0-2.2 L/day  EDUCATION NEEDS:   Education needs addressed  Rosemarie AxKate Erina Hamme Dietetic Intern Pager Number: [phone]

## 2016-10-08 NOTE — Progress Notes (Signed)
  Echocardiogram 2D Echocardiogram has been performed.  Arvil ChacoFoster, Yuritzy Zehring 10/08/2016, 8:36 AM

## 2016-10-08 NOTE — Care Management Note (Signed)
Case Management Note  Patient Details  Name: Karen Pugh MRN: [id number] Date of Birth: 10/18/1996  Subjective/Objective:        20 y.o.femalewith history of obesity and OCPwho presents with right leg pain. CTA in emergency room showed bilateral PEs without right heart strain. 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 Pt without PCP, CM [facility] with [facility] and provided [facility]. [facility] hospital f/u to be scheduled 10/20/2016 with Dr. Delford FieldWright ( Pulmonologist). CM made pt aware.      Action/Plan: Plan is to d/c to home today on Xaralto. CM spoke with pt regarding Jeanella CaraXarato and provided pt with 30 day free card for starter pak and zero co-pay card for refills.  CM will [facility] for patient pick up once discharge. Information explained to pt per CM  Expected Discharge Date:    10/08/2016           Expected Discharge Plan:  Home/Self Care  In-House Referral:     Discharge planning Services  CM Consult, Follow-up appt scheduled  Status of Service:  Completed, signed off  If discussed at Long Length of Stay Meetings, dates discussed:    Additional Comments:  Epifanio LeschesCole, Shontae Rosiles Hudson, RN 10/08/2016, 10:51 AM

## 2016-10-08 NOTE — Progress Notes (Signed)
CM faxed [facility]alto prescription [facility] @ [phone]. Gae GallopAngela Sher Shampine RN,BSN,CM

## 2016-10-11 NOTE — Discharge Summary (Signed)
[facility] Discharge Summary   Patient: Karen Pugh ZOX:[id number]   PCP: No primary care provider on file. DOB: 1996/10/19   Date of admission: 10/07/2016   Date of discharge: 10/08/2016     Discharge Diagnoses:  Principal Problem:   Bilateral pulmonary embolism (HCC) Active Problems:   EBV hepatitis   Right leg DVT (HCC)   Acute respiratory failure with hypoxia (HCC)   Morbid obesity with BMI of 50.0-59.9, adult (HCC)   Pulmonary hypertension   Admitted From: home Disposition:  home  Recommendations for Outpatient Follow-up:  1. Please follow up with PCP in 1 week and monthly thereafter, will need hypercoagulable panel in 1 month time   Follow-up Information    PCP. Schedule an appointment as soon as possible for a visit in 1 week(s).   Why:  also get hypercoagulable panel in 3 months.        Call [facility].   Why:   Pt is to call the [facility] on 10/11/2016 for post hospital f/u to be scheduled 10/20/2016 with Dr. Delford Field ( Pulmonologist) Contact information: [address] [place] [address] [phone]         Diet recommendation: regular diet  Activity: The patient is advised to gradually reintroduce usual activities.  Discharge Condition: good  Code Status: full code  History of present illness: As per the H and P dictated on admission, " Karen Pugh is a 20 y.o. female with medical history significant for Epstein-Barr virus related hepatitis, morbid obesity with BMI greater than 50 and prior spontaneous abortion in setting of pyelonephritis in the first trimester. Patient reports that this past Monday on 10/9 she developed significant right calf pain unresponsive to over-the-counter analgesics. She denies taking any recent prolonged trips. Does not feel she is dehydrated. No new medications. Denied shortness of breath or chest pain. Patient does take birth control pills. She initially presented to  the [facility] for evaluation but because of increasing pain and mild swelling she presented to the ER for further evaluation. Initial plan in the ER was to pursue outpatient therapy and initiate Xarelto and discharge patient from the ER. She was found to have resting tachycardia and when ambulated O2 saturation decreased to 90% therefore raising suspicion for pulmonary emboli. CT of the chest confirmed bilateral PE without RV strain."  Hospital Course:  Summary of her active problems in the hospital is as following. Principal Problem:   Bilateral pulmonary embolism (HCC) Active Problems:   Right leg DVT (HCC)   Acute respiratory failure with hypoxia (HCC)   Morbid obesity with BMI of 50.0-59.9, adult [facility])   Pulmonary hypertension Patient presented with complains of shortness of breath. CT scan of the chest was positive for bilateral PE although without any RV strain. Echocardiogram did not show any evidence of pulmonary hypertension. Patient did not have any evidence of hypoxia on the next day. Her symptoms of leg cramps also resolved. She denies any recent travel or immobilization or surgery. She also denies any hospitalization. There is no history of blood clotting disorder in the family. She also denies having any weight loss or night sweats. She has been using OC pills since last 3 months. Recommend her to hold off on also present discussed with her primary prior to discharge restarting it. Recommend her to use probably progestin only pills. She will remain on Xarelto for at least 3 months and will need a repeat DVT Doppler to  rule out ongoing DVT. She would also need hypercoagulable panel doing her age to rule out any hereditary causes. She was also informed regarding increased risk for menorrhagia with anticoagulation.    All other chronic medical condition were stable during the hospitalization.  Patient was ambulatory without any assistance. On the day of  the discharge the patient's vitals remained stable both at rest as well as on exertion, and no other acute medical condition were reported by patient. the patient was felt safe to be discharge at home with family.  Procedures and Results:  Echocardiogram   Consultations:  none  DISCHARGE MEDICATION: Discharge Medication List as of 10/08/2016 12:36 PM    START taking these medications   Details  Rivaroxaban 15 & 20 MG TBPK Take as directed on package: Start with one 15mg  tablet by mouth twice a day with food. On Day 22, switch to one 20mg  tablet once a day with food., Print       No Known Allergies Discharge Instructions    Diet general    Complete by:  As directed    Discharge instructions    Complete by:  As directed    It is important that you read following instructions as well as go over your medication list with RN to help you understand your care after this hospitalization.  Discharge Instructions: Please follow-up with PCP in one week, and on regular basis thereafter. Please get hypercoagulable panel in 3 month(in January 2018) to determine your risk of future clot developing.  Please request your primary care physician to go over all Hospital Tests and Procedure/Radiological results at the follow up,  Please get all Hospital records sent to your PCP by signing hospital release before you go home.   You were cared for by a hospitalist during your hospital stay. If you have any questions about your discharge medications or the care you received while you were in the hospital after you are discharged, you can call the unit and ask to speak with the hospitalist on call if the hospitalist that took care of you is not available.  Once you are discharged, your primary care physician will handle any further medical issues. Please note that NO REFILLS for any discharge medications will be authorized once you are discharged, as it is imperative that you return to your primary care  physician (or establish a relationship with a primary care physician if you do not have one) for your aftercare needs so that they can reassess your need for medications and monitor your lab values. You Must read complete instructions/literature along with all the possible adverse reactions/side effects for all the Medicines you take and that have been prescribed to you. Take any new Medicines after you have completely understood and accept all the possible adverse reactions/side effects. Wear Seat belts while driving. If you have smoked or chewed Tobacco in the last 2 yrs please stop smoking and/or stop any Recreational drug use.   Increase activity slowly    Complete by:  As directed      Discharge Exam: Filed Weights   10/07/16 0949 10/07/16 1612  Weight: (!) 138.3 kg (305 lb) (!) 139.5 kg (307 lb 8 oz)   Vitals:   10/07/16 2123 10/08/16 0541  BP: 114/72 (!) 92/47  Pulse: 93 89  Resp: 18 18  Temp: 98 F (36.7 C) 97.8 F (36.6 C)   General: Appear in no distress, no Rash; Oral Mucosa moist. Cardiovascular: S1 and S2 Present, no Murmur,  no JVD Respiratory: Bilateral Air entry present and Clear to Auscultation, no Crackles, no wheezes Abdomen: Bowel Sound present, Soft and no tenderness Extremities: no Pedal edema, no calf tenderness Neurology: Grossly no focal neuro deficit.  The results of significant diagnostics from this hospitalization (including imaging, microbiology, ancillary and laboratory) are listed below for reference.    Significant Diagnostic Studies: Ct Angio Chest Pe W/cm &/or Wo Cm  Result Date: 10/07/2016 CLINICAL DATA:  Tachycardia and decreased oxygen saturation. Shortness of breath EXAM: CT ANGIOGRAPHY CHEST WITH CONTRAST TECHNIQUE: Multidetector CT imaging of the chest was performed using the standard protocol during bolus administration of intravenous contrast. Multiplanar CT image reconstructions and MIPs were obtained to evaluate the vascular anatomy.  CONTRAST:  100 mL Isovue 370 nonionic COMPARISON:  None. FINDINGS: Cardiovascular: There are multiple pulmonary emboli noted throughout multiple lower lobe branches bilaterally. There is also pulmonary embolus in branches of the right upper lobe. Pulmonary embolus arises at the origin of the right upper lobe pulmonary artery. The right ventricle to left ventricle diameter ratio is less than 0.9 and does not meet criteria for right heart strain. There is no appreciable thoracic aortic aneurysm or dissection. The visualized great vessels appear unremarkable. There is no appreciable pericardial thickening. The diameter of the main pulmonary outflow tract is 4.0 cm suggesting a degree of underlying pulmonary arterial hypertension. Mediastinum/Nodes: Visualized thyroid is normal. There is no evident thoracic adenopathy. Lungs/Pleura: There is no edema or consolidation. No pleural effusions are evident. There is no evidence suggesting pulmonary infarct. There is slight atelectasis in the anterior left base. Upper Abdomen: Visualized upper abdominal structures appear unremarkable. Musculoskeletal: There are no blastic or lytic bone lesions. Review of the MIP images confirms the above findings. IMPRESSION: Multiple pulmonary emboli without right heart strain. Prominence of the main pulmonary artery suggesting underlying pulmonary arterial hypertension. Lungs clear except for slight anterior left base atelectasis. No evidence of adenopathy. Critical Value/emergent results were called by telephone at the time of interpretation on 10/07/2016 at 12:28 pm to Dr. Terance Hart , who verbally acknowledged these results. Electronically Signed   By: Bretta Bang III M.D.   On: 10/07/2016 12:29    Microbiology: No results found for this or any previous visit (from the past 240 hour(s)).   Labs: CBC:  Recent Labs Lab 10/07/16 1230  WBC 11.5*  NEUTROABS 7.9*  HGB 13.7  HCT 39.5  MCV 81.4  PLT 225   Basic Metabolic  Panel:  Recent Labs Lab 10/07/16 1200 10/07/16 1230  NA  --  133*  K  --  3.9  CL  --  103  CO2  --  21*  GLUCOSE  --  94  BUN  --  6  CREATININE 1.20* 1.27*  CALCIUM  --  8.9   Liver Function Tests:  Recent Labs Lab 10/07/16 1317  AST 27  ALT 23  ALKPHOS 59  BILITOT 0.8  PROT 6.8  ALBUMIN 3.7   No results for input(s): LIPASE, AMYLASE in the last 168 hours. No results for input(s): AMMONIA in the last 168 hours. Cardiac Enzymes:  Recent Labs Lab 10/07/16 2001 10/08/16 0225 10/08/16 0725  TROPONINI <0.03 <0.03 <0.03   BNP (last 3 results) No results for input(s): BNP in the last 8760 hours. CBG: No results for input(s): GLUCAP in the last 168 hours. Time spent: 30 minutes  Signed:  PATEL, PRANAV  [facility] 10/08/2016 , 7:26 AM

## 2016-10-20 ENCOUNTER — Encounter: Payer: Self-pay | Admitting: Critical Care Medicine

## 2016-10-20 ENCOUNTER — Ambulatory Visit: Payer: BLUE CROSS/BLUE SHIELD | Attending: Critical Care Medicine | Admitting: Critical Care Medicine

## 2016-10-20 VITALS — BP 111/77 | HR 89 | Temp 98.0°F | Resp 18 | Ht 65.0 in | Wt 310.2 lb

## 2016-10-20 DIAGNOSIS — Z7901 Long term (current) use of anticoagulants: Secondary | ICD-10-CM | POA: Diagnosis not present

## 2016-10-20 DIAGNOSIS — Z6841 Body Mass Index (BMI) 40.0 and over, adult: Secondary | ICD-10-CM | POA: Diagnosis not present

## 2016-10-20 DIAGNOSIS — I2699 Other pulmonary embolism without acute cor pulmonale: Secondary | ICD-10-CM | POA: Diagnosis not present

## 2016-10-20 DIAGNOSIS — I82431 Acute embolism and thrombosis of right popliteal vein: Secondary | ICD-10-CM | POA: Diagnosis not present

## 2016-10-20 DIAGNOSIS — Z86711 Personal history of pulmonary embolism: Secondary | ICD-10-CM | POA: Insufficient documentation

## 2016-10-20 MED ORDER — RIVAROXABAN 20 MG PO TABS: 20.0000 mg | ORAL_TABLET | Freq: Every day | ORAL | 2 refills | Status: AC

## 2016-10-20 NOTE — Assessment & Plan Note (Signed)
Weight loss recommended 

## 2016-10-20 NOTE — Assessment & Plan Note (Signed)
See PE assessment

## 2016-10-20 NOTE — Progress Notes (Signed)
Patient is here for FU bilateral PE  Patient denies pain, SOB, WHEEZING, EDEMA at this time.  Patient has taken medication today. Patient has eaten today.  Patient declined the flu vaccine today.

## 2016-10-20 NOTE — Progress Notes (Signed)
Subjective:    Patient ID: Karen Pugh, female    DOB: 11-11-1996, 20 y.o.   MRN: [id number]  Post hosp f/u  20 y.o. female with medical history significant for Epstein-Barr virus related hepatitis, morbid obesity with BMI greater than 50 and prior spontaneous abortion in setting of pyelonephritis in the first trimester and adm 10/12 with DVT and acute PE . Hx of BCP use, just started 03/2016.  Obesity.  No prior VTE hx.  No chronic swelling. No family hx.  Pt noted onset of leg pain RLE, leg was swollen.  No real change in breathing.  No real chest pain PE and DVT RLE seen Rx heparin then xarelto.  No access problems.  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    Shortness of Breath  This is a new problem. The current episode started 1 to 4 weeks ago. The problem occurs rarely. The problem has been rapidly improving. Associated symptoms include leg pain. Pertinent negatives include no abdominal pain, chest pain, fever, hemoptysis, leg swelling, orthopnea or wheezing. Nothing aggravates the symptoms. Associated symptoms comments: Leg pain is gone .Marland Kitchen There is no history of asthma, PE or a recent surgery.   Past Medical History:  Diagnosis Date  . Bilateral pulmonary embolism (HCC) 10/07/2016  . DVT (deep venous thrombosis) (HCC) 10/07/2016   RLE     History reviewed. No pertinent family history.   Social History   Social History  . Marital status: Single    Spouse name: N/A  . Number of children: N/A  . Years of education: N/A   Occupational History  . Not on file.   Social History Main Topics  . Smoking status: Never Smoker  . Smokeless tobacco: Never Used  . Alcohol use No  . Drug use: No  . Sexual activity: Yes    Birth control/ protection: Pill   Other Topics Concern  . Not on file   Social History Narrative  . No narrative on file     No Known Allergies   Outpatient Medications Prior to Visit  Medication Sig Dispense Refill  . Rivaroxaban 15 & 20 MG  TBPK Take as directed on package: Start with one 15mg  tablet by mouth twice a day with food. On Day 22, switch to one 20mg  tablet once a day with food. 51 each 0   No facility-administered medications prior to visit.       Review of Systems  Constitutional: Negative.  Negative for fever.  HENT: Negative.   Respiratory: Positive for shortness of breath. Negative for cough, hemoptysis, chest tightness, wheezing and stridor.   Cardiovascular: Negative for chest pain, orthopnea and leg swelling.  Gastrointestinal: Negative for abdominal pain.  Hematological: Does not bruise/bleed easily.       Objective:   Physical Exam Vitals:   10/20/16 1048  BP: 111/77  Pulse: 89  Resp: 18  Temp: 98 F (36.7 C)  TempSrc: Oral  SpO2: 100%  Weight: (!) 310 lb 3.2 oz (140.7 kg)  Height: 5\' 5"  (1.651 m)    Gen: Pleasant, obese,  in no distress,  normal affect  ENT: No lesions,  mouth clear,  oropharynx clear, no postnasal drip  Neck: No JVD, no TMG, no carotid bruits  Lungs: No use of accessory muscles, no dullness to percussion, clear without rales or rhonchi  Cardiovascular: RRR, heart sounds normal, no murmur or gallops, no peripheral edema  Abdomen: soft and NT, no HSM,  BS normal  Musculoskeletal: No deformities, no cyanosis or clubbing  Neuro: alert, non focal  Skin: Warm, no lesions or rashes  No results found.   CT Chest 10/12 IMPRESSION: Multiple pulmonary emboli without right heart strain.  Prominence of the main pulmonary artery suggesting underlying pulmonary arterial hypertension.     Assessment & Plan:  I personally reviewed [facility] as well as any outside material available during this office visit and agree with the  radiology impressions.   Bilateral pulmonary embolism (HCC) Bilateral pulm embolism d/t obesity and BCP use DVT as well RLE Plan  3 months xarelto total F/u hypercoag labs in 3 months Stay off  BCPs  Right leg DVT (HCC) See PE assessment  Morbid obesity with BMI of 50.0-59.9, adult (HCC) Weight loss recommended    [name] was seen today for hospitalization follow-up.  Diagnoses and all orders for this visit:  Other acute pulmonary embolism without acute cor pulmonale (HCC)  Obesity, morbid (HCC)  Bilateral pulmonary embolism (HCC)  Acute deep vein thrombosis (DVT) of popliteal vein of right lower extremity (HCC)  Morbid obesity with BMI of 50.0-59.9, adult (HCC)  Other orders -     rivaroxaban (XARELTO) 20 MG TABS tablet; Take 1 tablet (20 mg total) by mouth daily with breakfast.

## 2016-10-20 NOTE — Assessment & Plan Note (Signed)
Bilateral pulm embolism d/t obesity and BCP use DVT as well RLE Plan  3 months xarelto total F/u hypercoag labs in 3 months Stay off BCPs

## 2016-10-20 NOTE — Patient Instructions (Signed)
Finish 15mg  twice daily xarelto The 20mg  daily xarelto will start after 15mg  dosing complete, refills on the 20mg  daily xarelto sent to [employer] [facility] Return 2 months Stop birth control pills Focus on weight loss

## 2017-05-02 IMAGING — CT CT ANGIO CHEST
2 of 6 series · 18 of 36 positions shown · IV contrast (Omni 300)
Comparison: None.

CLINICAL DATA: Tachycardia and decreased oxygen saturation.
Shortness of breath

EXAM:
CT ANGIOGRAPHY CHEST WITH CONTRAST
TECHNIQUE: Multidetector CT imaging of the chest was performed using the
standard protocol during bolus administration of intravenous
contrast. Multiplanar CT image reconstructions and MIPs were
obtained to evaluate the vascular anatomy.
CONTRAST:  100 mL Isovue 370 nonionic

[Series 6: pe thins · axial · 0.63mm/px · z∈[+1072,+1290]mm · 17 of 247 slices shown]
[im 14/247  lung]
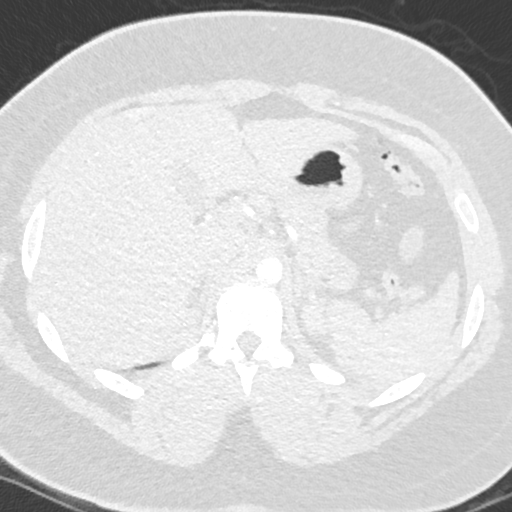
[im 28/247  mediastinal]
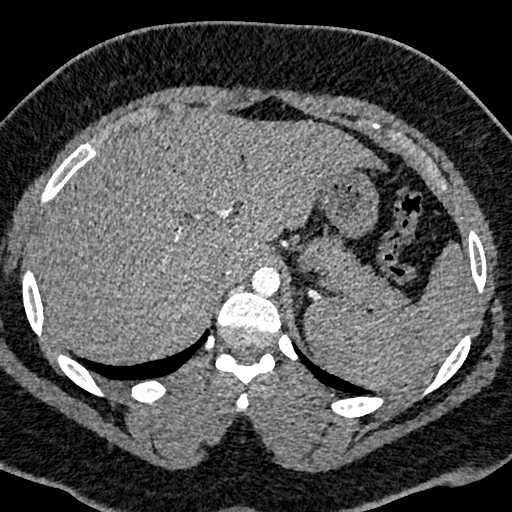
[im 42/247  lung]
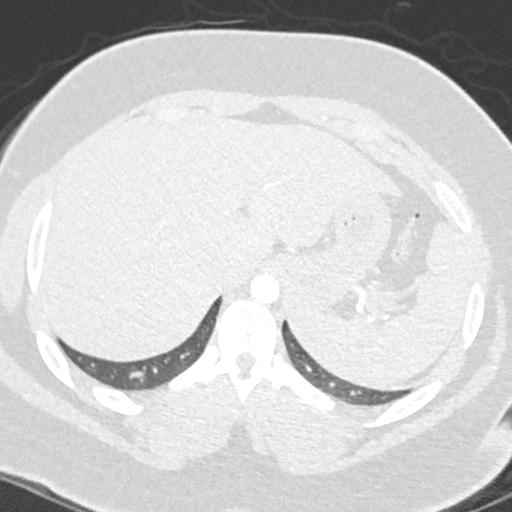
[im 55/247  mediastinal]
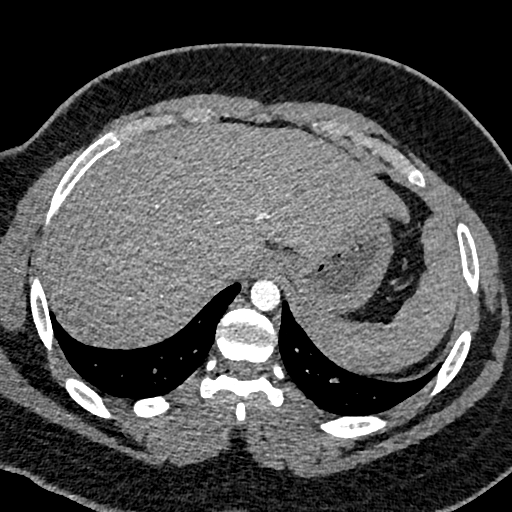
[im 69/247  lung]
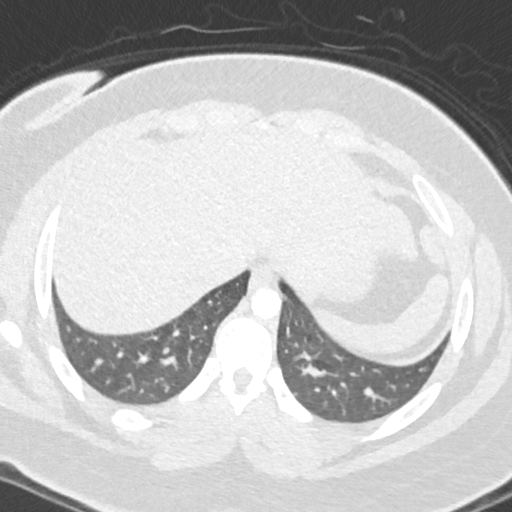
[im 83/247  mediastinal]
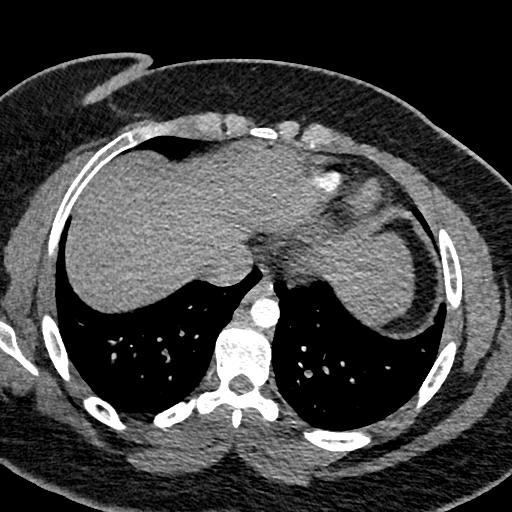
[im 96/247  lung]
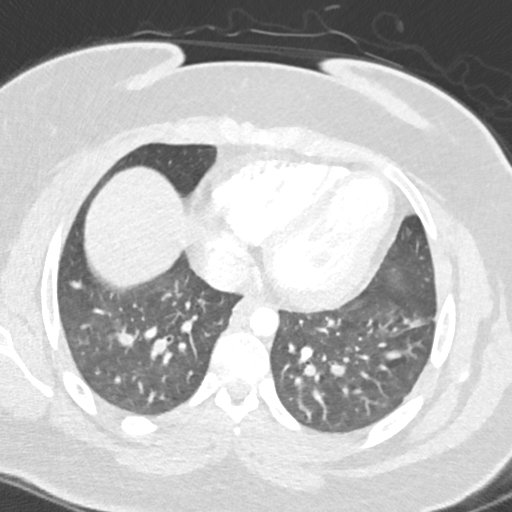
[im 110/247  mediastinal]
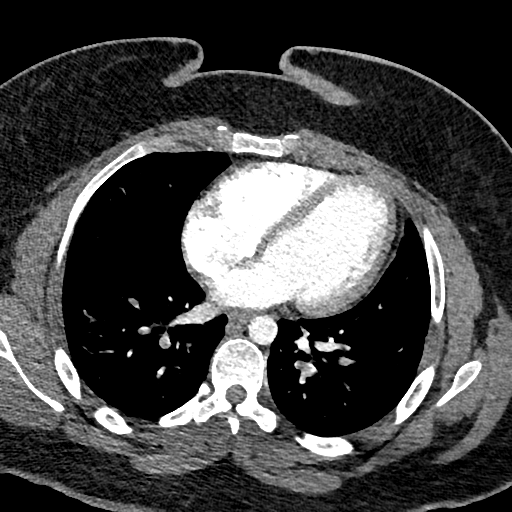
[im 124/247  lung]
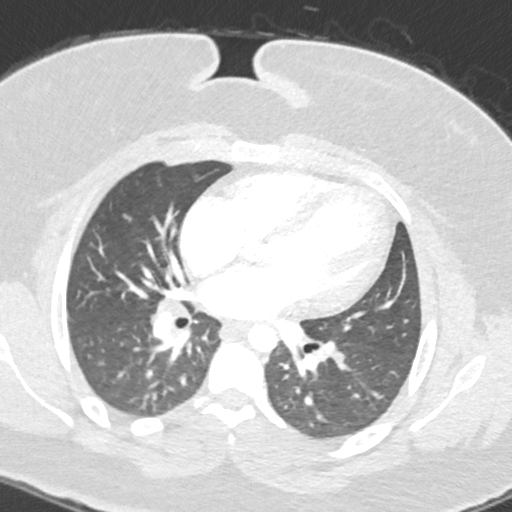
[im 137/247  mediastinal]
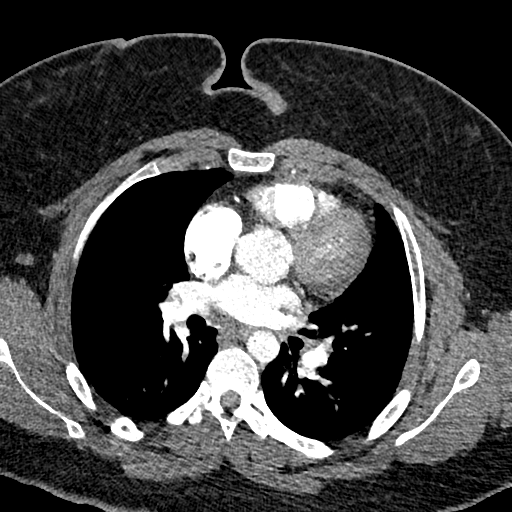
[im 151/247  lung]
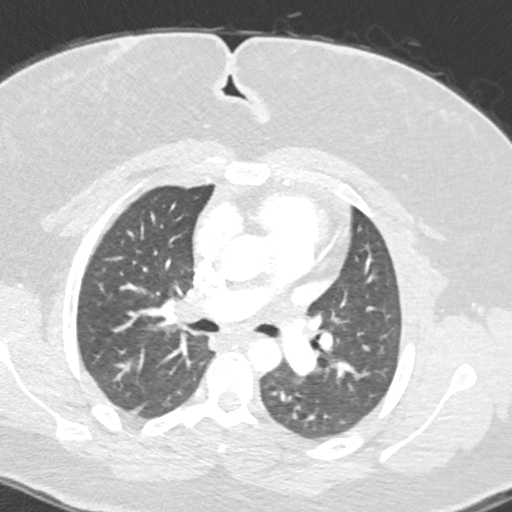
[im 165/247  mediastinal]
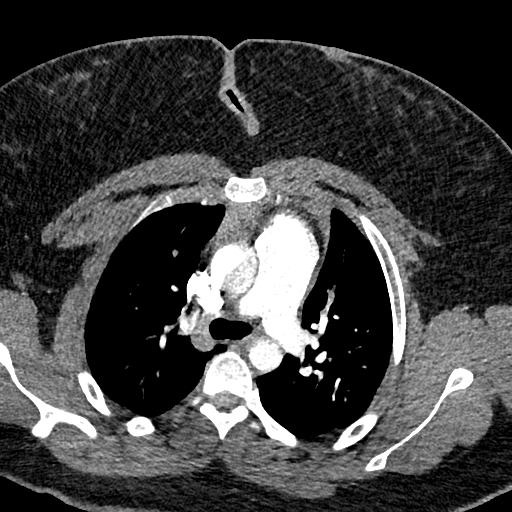
[im 178/247  lung]
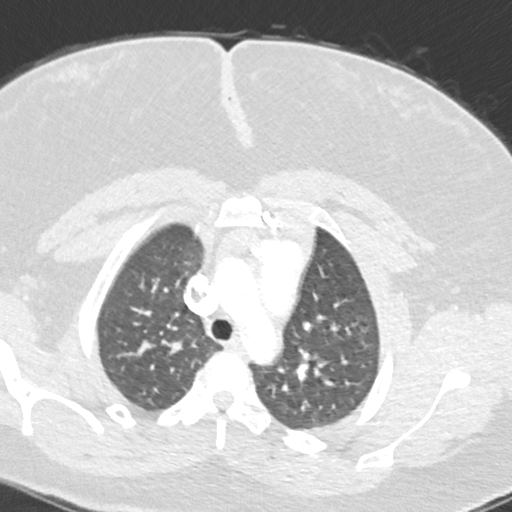
[im 192/247  mediastinal]
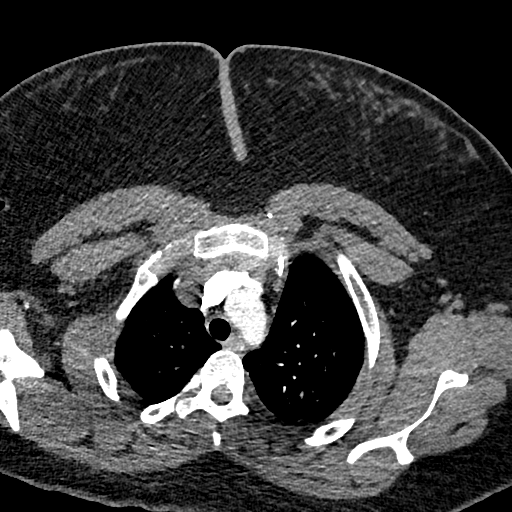
[im 206/247  lung]
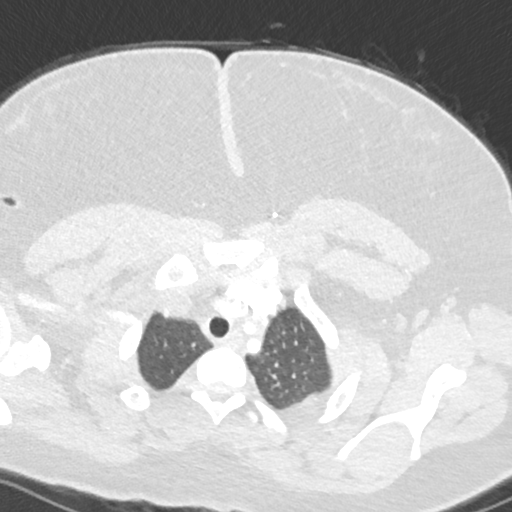
[im 219/247  mediastinal]
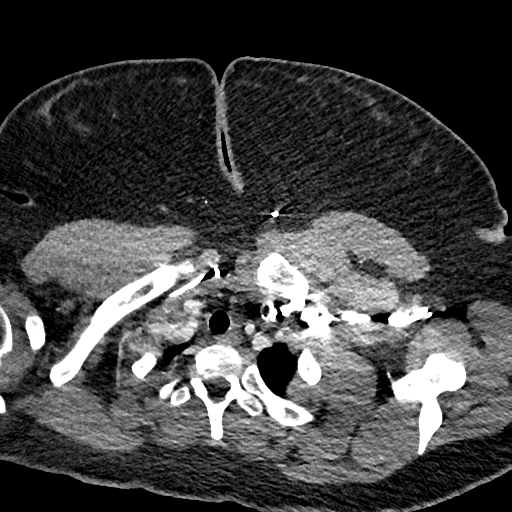
[im 233/247  lung]
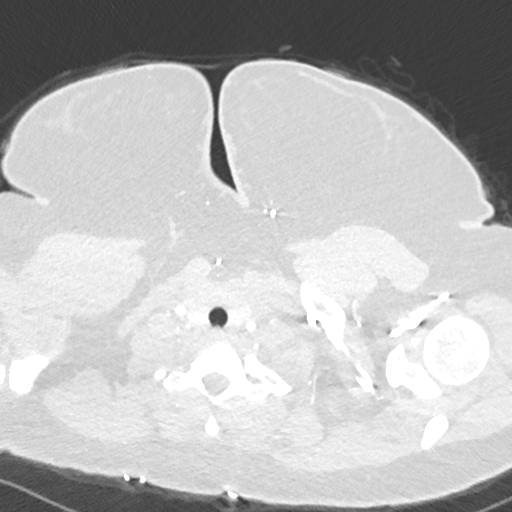

[Series 7: pe 2mm cor · coronal · 0.48mm/px · 1 of 113 slices shown]
[im 57/113  mediastinal]
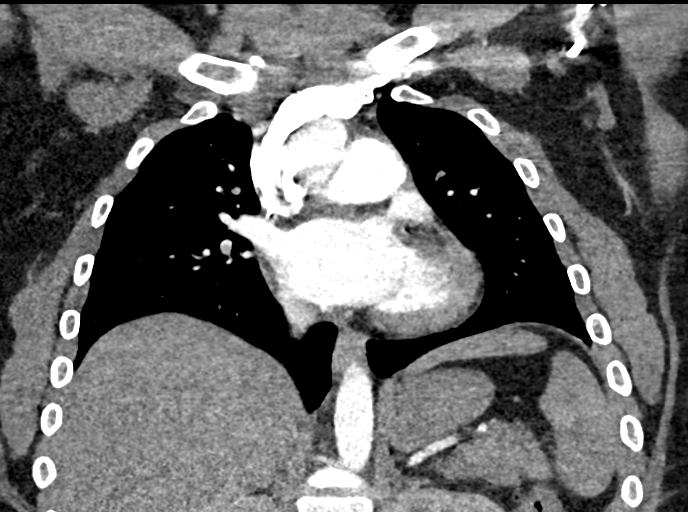

[18 of 36 positions shown; findings below may reference images not displayed]

FINDINGS: Cardiovascular: There are multiple pulmonary emboli noted throughout
multiple lower lobe branches bilaterally. There is also pulmonary
embolus in branches of the right upper lobe. Pulmonary embolus
arises at the origin of the right upper lobe pulmonary artery. The
right ventricle to left ventricle diameter ratio is less than
and does not meet criteria for right heart strain.

There is no appreciable thoracic aortic aneurysm or dissection. The
visualized great vessels appear unremarkable. There is no
appreciable pericardial thickening. The diameter of the main
pulmonary outflow tract is 4.0 cm suggesting a degree of underlying
pulmonary arterial hypertension.

Mediastinum/Nodes: Visualized thyroid is normal. There is no evident
thoracic adenopathy.

Lungs/Pleura: There is no edema or consolidation. No pleural
effusions are evident. There is no evidence suggesting pulmonary
infarct. There is slight atelectasis in the anterior left base.

Upper Abdomen: Visualized upper abdominal structures appear
unremarkable.

Musculoskeletal: There are no blastic or lytic bone lesions.

Review of the MIP images confirms the above findings.
IMPRESSION: Multiple pulmonary emboli without right heart strain.

Prominence of the main pulmonary artery suggesting underlying
pulmonary arterial hypertension.

Lungs clear except for slight anterior left base atelectasis. No
evidence of adenopathy.

Critical Value/emergent results were called by telephone at the time
of interpretation on 10/07/2016 at [DATE] to Dr. KLPIGBB MOOLMAN , who
verbally acknowledged these results.
